# Patient Record
Sex: Male | Born: 1985 | Race: Black or African American | Hispanic: No | Marital: Single | Smoking: Current every day smoker
Health system: Southern US, Community
[De-identification: ages and names within clinical notes are randomized; demographics above are authoritative.]

---
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
## 2005-06-03 IMAGING — CR DG ANKLE COMPLETE 3+V*R*
3 series · 3 of 3 positions shown · non-contrast
Comparison: none

CLINICAL DATA: Fell playing basketball, with pain.
 RIGHT ANKLE - 3 VIEWS:
 Three views of the right ankle were obtained.  There is soft tissue swelling laterally.  However, no fracture is seen.  The ankle joint appears normal.

[view not recorded (1 of 3)]
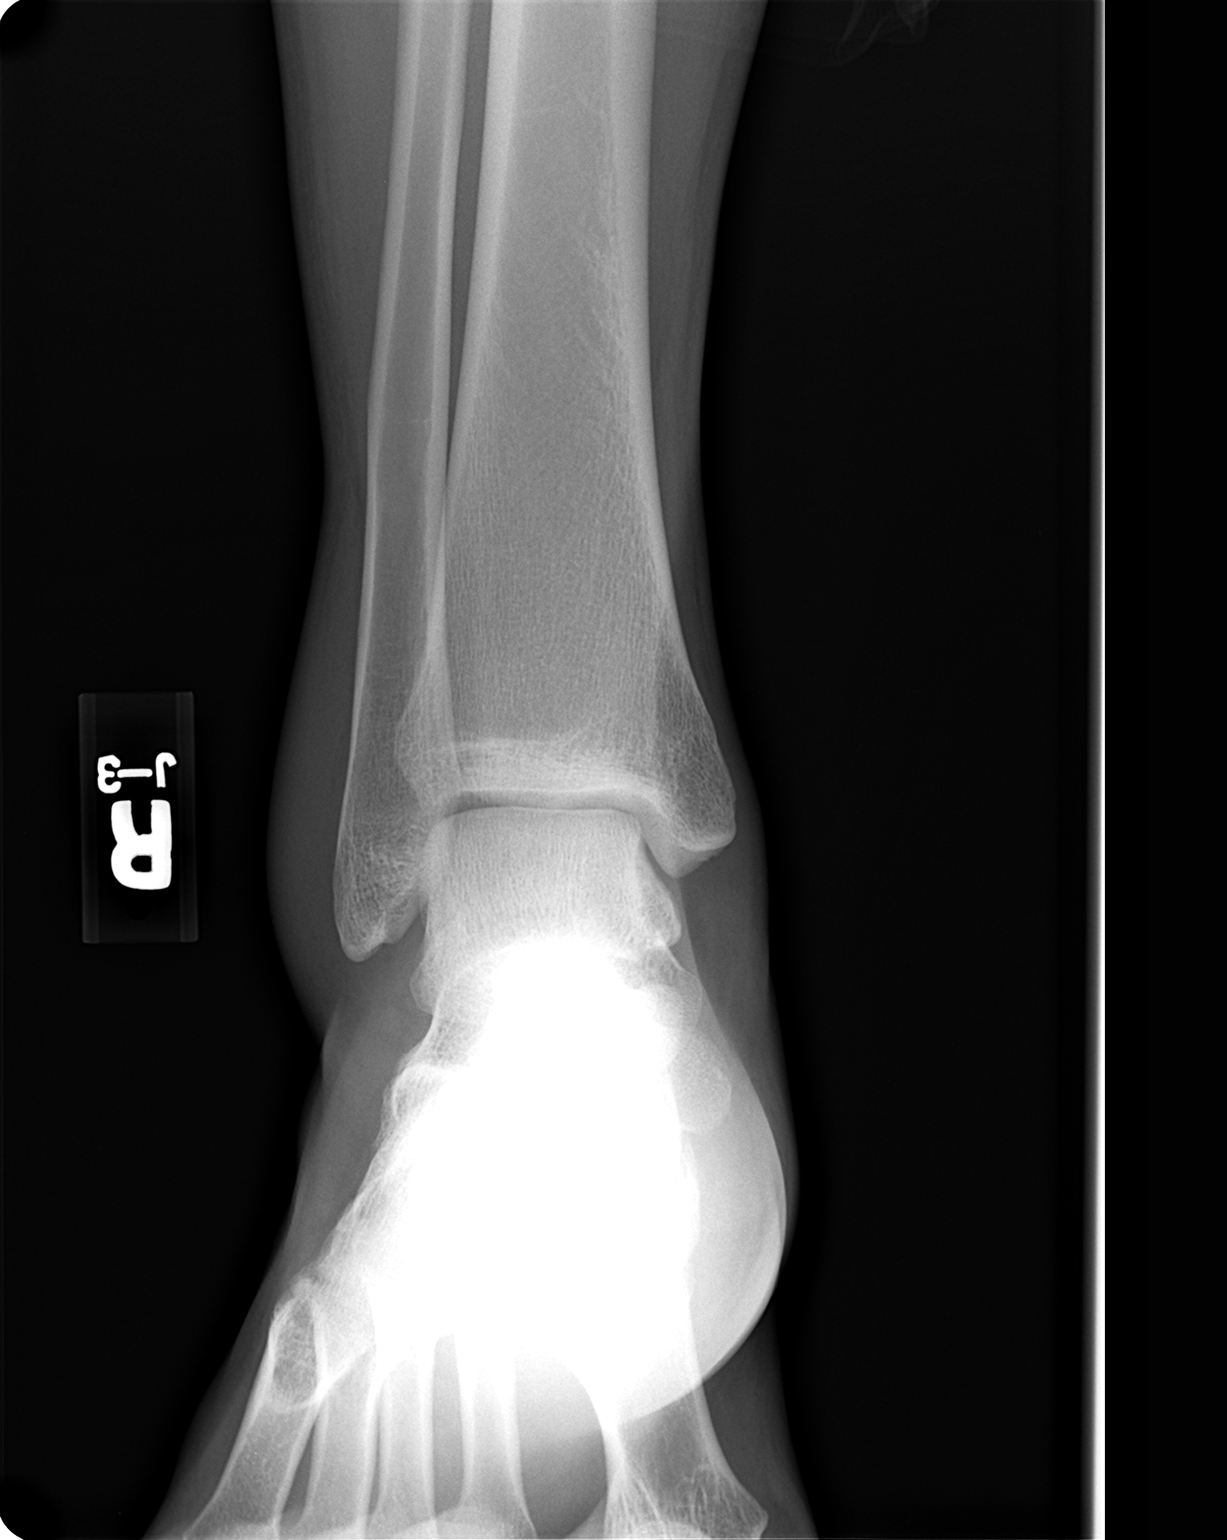

[view not recorded (2 of 3)]
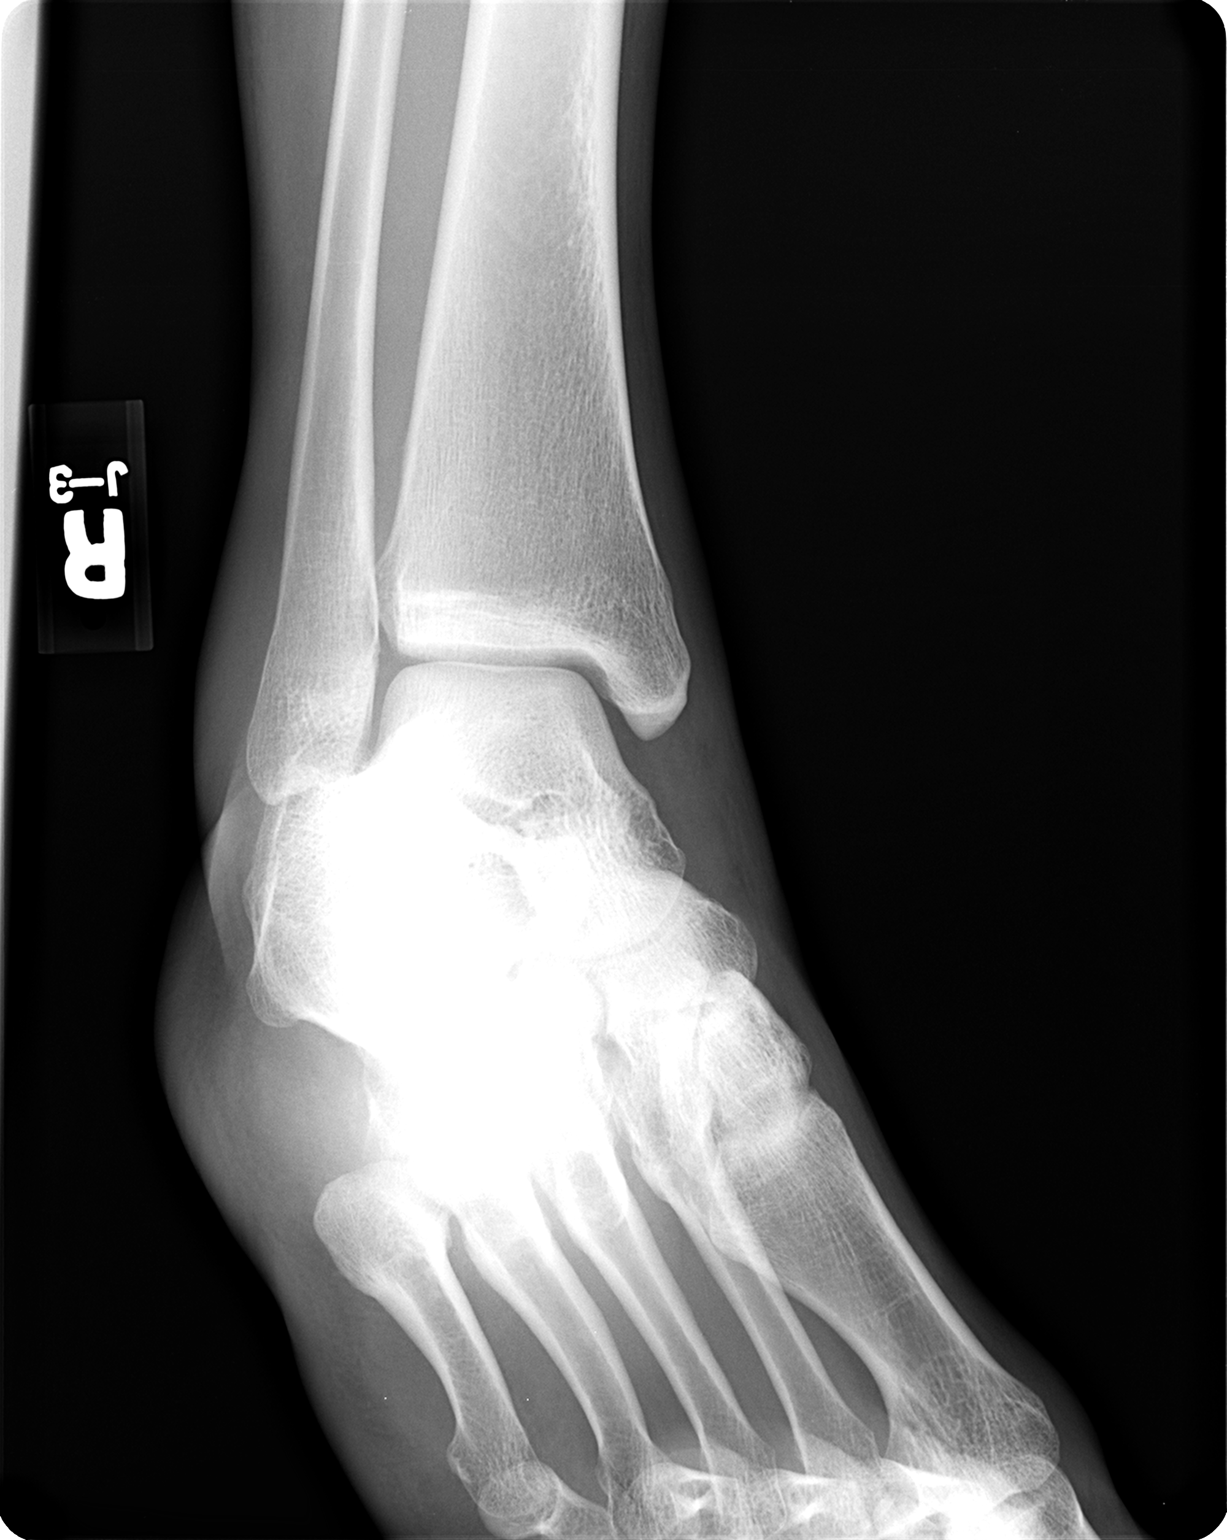

[view not recorded (3 of 3)]
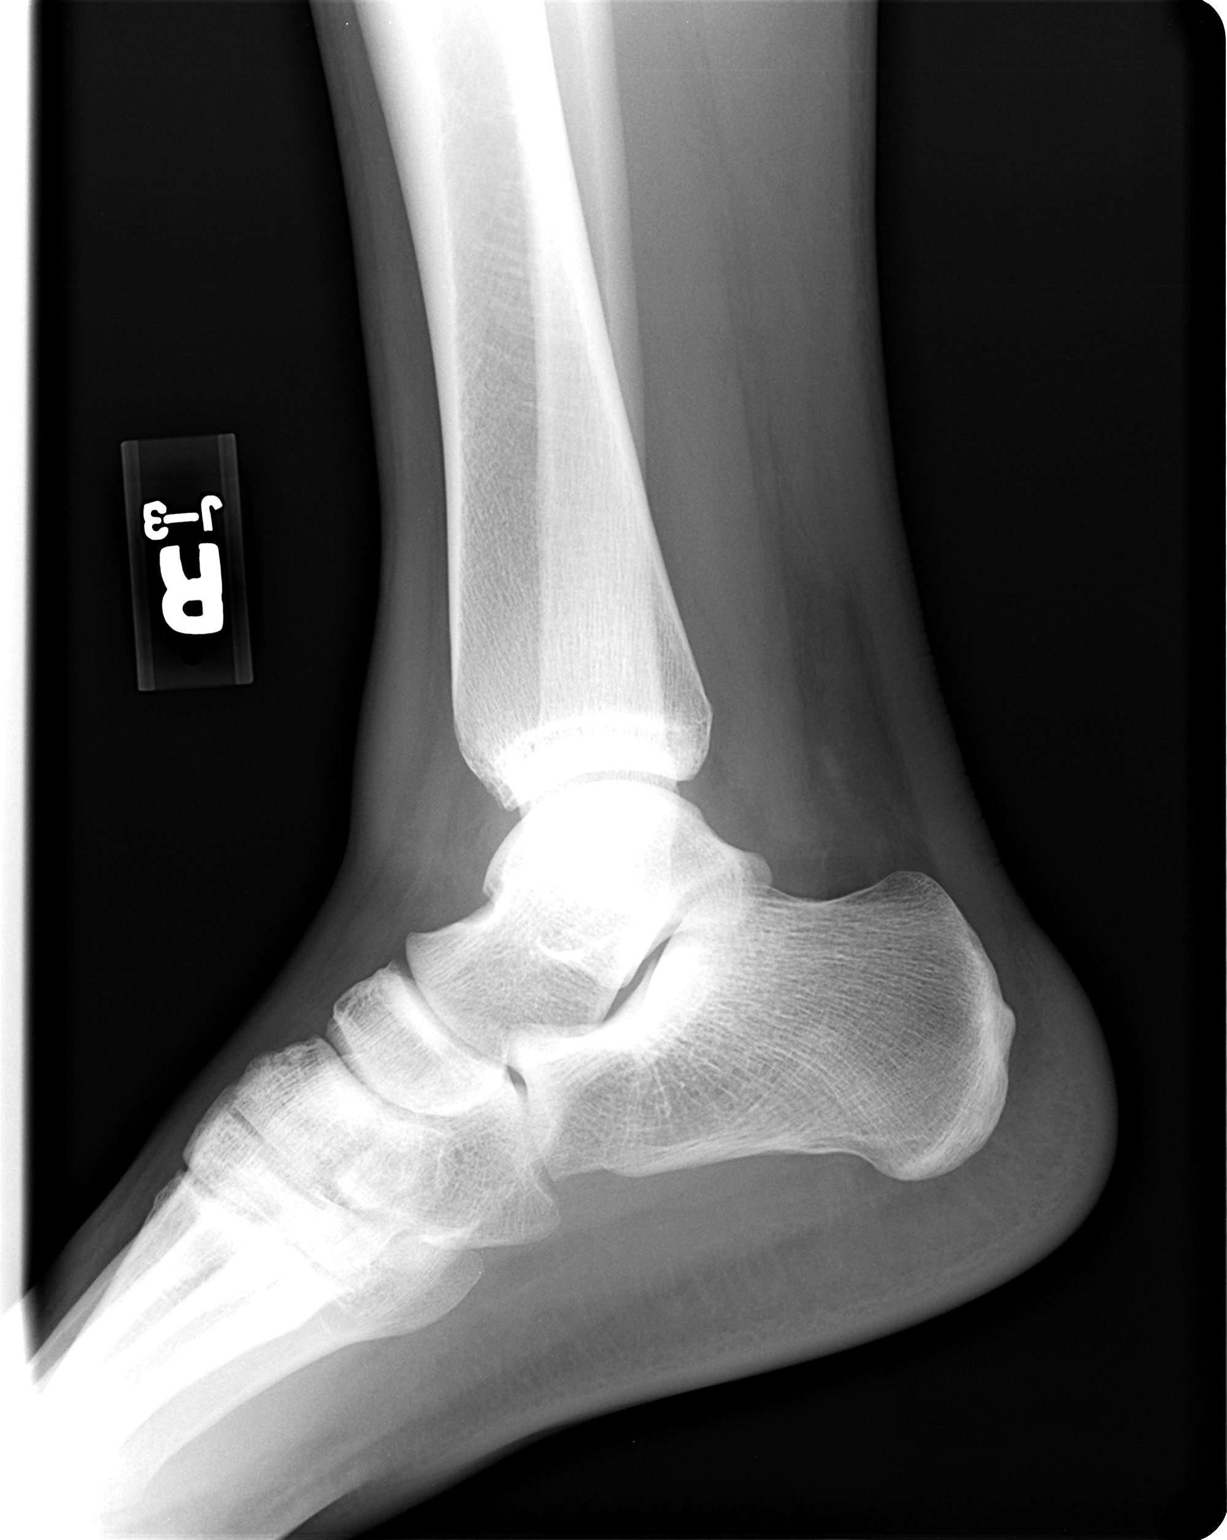

[3 of 3 positions shown; findings below may reference images not displayed]

IMPRESSION: No fracture.

## 2010-04-15 ENCOUNTER — Emergency Department (HOSPITAL_COMMUNITY)
Admission: EM | Admit: 2010-04-15 | Discharge: 2010-04-15 | Payer: Self-pay | Source: Home / Self Care | Admitting: Emergency Medicine

## 2010-04-15 LAB — DIFFERENTIAL
Basophils Absolute: 0 10*3/uL (ref 0.0–0.1)
Basophils Relative: 0 % (ref 0–1)
Eosinophils Absolute: 0.1 10*3/uL (ref 0.0–0.7)
Eosinophils Relative: 1 % (ref 0–5)
Lymphocytes Relative: 3 % — ABNORMAL LOW (ref 12–46)
Lymphs Abs: 0.3 10*3/uL — ABNORMAL LOW (ref 0.7–4.0)
Monocytes Absolute: 0.5 10*3/uL (ref 0.1–1.0)
Monocytes Relative: 4 % (ref 3–12)
Neutro Abs: 11.7 10*3/uL — ABNORMAL HIGH (ref 1.7–7.7)
Neutrophils Relative %: 92 % — ABNORMAL HIGH (ref 43–77)

## 2010-04-15 LAB — COMPREHENSIVE METABOLIC PANEL
Alkaline Phosphatase: 49 U/L (ref 39–117)
BUN: 12 mg/dL (ref 6–23)
CO2: 23 mEq/L (ref 19–32)
Chloride: 100 mEq/L (ref 96–112)
Creatinine, Ser: 0.88 mg/dL (ref 0.4–1.5)
GFR calc non Af Amer: >60 mL/min (ref >60)
Glucose, Bld: 119 mg/dL — ABNORMAL HIGH (ref 70–99)
Potassium: 4 mEq/L (ref 3.5–5.1)
Total Bilirubin: 1 mg/dL (ref 0.3–1.2)

## 2010-04-15 LAB — CBC
HCT: 45.6 % (ref 39.0–52.0)
Hemoglobin: 15.4 g/dL (ref 13.0–17.0)
MCH: 28.1 pg (ref 26.0–34.0)
MCHC: 33.8 g/dL (ref 30.0–36.0)
MCV: 83.1 fL (ref 78.0–100.0)
Platelets: 221 10*3/uL (ref 150–400)
RBC: 5.49 MIL/uL (ref 4.22–5.81)
RDW: 12.2 % (ref 11.5–15.5)
WBC: 12.6 10*3/uL — ABNORMAL HIGH (ref 4.0–10.5)

## 2010-04-15 LAB — LIPASE, BLOOD: Lipase: 21 U/L (ref 11–59)

## 2015-11-17 ENCOUNTER — Encounter (HOSPITAL_COMMUNITY): Payer: Self-pay | Admitting: Vascular Surgery

## 2015-11-17 ENCOUNTER — Emergency Department (HOSPITAL_COMMUNITY): Payer: Self-pay

## 2015-11-17 ENCOUNTER — Emergency Department (HOSPITAL_COMMUNITY)
Admission: EM | Admit: 2015-11-17 | Discharge: 2015-11-17 | Disposition: A | Discharge status: Home / Self Care | Payer: Self-pay | Attending: Emergency Medicine | Admitting: Emergency Medicine

## 2015-11-17 DIAGNOSIS — Y999 Unspecified external cause status: Secondary | ICD-10-CM | POA: Insufficient documentation

## 2015-11-17 DIAGNOSIS — Y92410 Unspecified street and highway as the place of occurrence of the external cause: Secondary | ICD-10-CM | POA: Insufficient documentation

## 2015-11-17 DIAGNOSIS — F17210 Nicotine dependence, cigarettes, uncomplicated: Secondary | ICD-10-CM | POA: Insufficient documentation

## 2015-11-17 DIAGNOSIS — S46811A Strain of other muscles, fascia and tendons at shoulder and upper arm level, right arm, initial encounter: Secondary | ICD-10-CM | POA: Insufficient documentation

## 2015-11-17 DIAGNOSIS — T148XXA Other injury of unspecified body region, initial encounter: Secondary | ICD-10-CM

## 2015-11-17 DIAGNOSIS — Y939 Activity, unspecified: Secondary | ICD-10-CM | POA: Insufficient documentation

## 2015-11-17 MED ORDER — METHOCARBAMOL 500 MG PO TABS: 500.0000 mg | ORAL_TABLET | Freq: Two times a day (BID) | ORAL | 0 refills | Status: AC | PRN

## 2015-11-17 MED ORDER — NAPROXEN 500 MG PO TABS: 500.0000 mg | ORAL_TABLET | Freq: Two times a day (BID) | ORAL | 0 refills | Status: AC | PRN

## 2015-11-17 MED ORDER — METHOCARBAMOL 500 MG PO TABS
500.0000 mg | ORAL_TABLET | Freq: Once | ORAL | Status: AC
Start: 2015-11-17 — End: 2015-11-17
  Administered 2015-11-17: 500 mg via ORAL
  Filled 2015-11-17: qty 1

## 2015-11-17 MED ORDER — NAPROXEN 250 MG PO TABS
500.0000 mg | ORAL_TABLET | Freq: Once | ORAL | Status: AC
  Administered 2015-11-17: 500 mg via ORAL
  Filled 2015-11-17: qty 2
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
